# Patient Record
Sex: Female | Born: 1975 | Race: Black or African American | Hispanic: No | Marital: Single | Smoking: Never smoker
Health system: Southern US, Community
[De-identification: ages and names within clinical notes are randomized; demographics above are authoritative.]

## PROBLEM LIST (undated history)

## (undated) DIAGNOSIS — I10 Essential (primary) hypertension: Secondary | ICD-10-CM

---

## 1997-08-12 ENCOUNTER — Emergency Department (HOSPITAL_COMMUNITY): Admission: EM | Admit: 1997-08-12 | Discharge: 1997-08-12 | Payer: Self-pay | Admitting: Emergency Medicine

## 1997-10-24 ENCOUNTER — Emergency Department (HOSPITAL_COMMUNITY): Admission: EM | Admit: 1997-10-24 | Discharge: 1997-10-24 | Payer: Self-pay | Admitting: Emergency Medicine

## 1997-11-21 ENCOUNTER — Other Ambulatory Visit: Admission: RE | Admit: 1997-11-21 | Discharge: 1997-11-21 | Payer: Self-pay | Admitting: Gynecology

## 1997-12-19 ENCOUNTER — Emergency Department (HOSPITAL_COMMUNITY): Admission: EM | Admit: 1997-12-19 | Discharge: 1997-12-19 | Payer: Self-pay | Admitting: Emergency Medicine

## 1998-06-17 ENCOUNTER — Emergency Department (HOSPITAL_COMMUNITY): Admission: EM | Admit: 1998-06-17 | Discharge: 1998-06-17 | Payer: Self-pay | Admitting: Emergency Medicine

## 1998-06-19 ENCOUNTER — Emergency Department (HOSPITAL_COMMUNITY): Admission: EM | Admit: 1998-06-19 | Discharge: 1998-06-19 | Payer: Self-pay | Admitting: Emergency Medicine

## 1999-06-30 ENCOUNTER — Encounter: Payer: Self-pay | Admitting: Orthopedic Surgery

## 1999-06-30 ENCOUNTER — Ambulatory Visit (HOSPITAL_COMMUNITY): Admission: RE | Admit: 1999-06-30 | Discharge: 1999-06-30 | Payer: Self-pay | Admitting: Orthopedic Surgery

## 2001-06-14 ENCOUNTER — Emergency Department (HOSPITAL_COMMUNITY): Admission: EM | Admit: 2001-06-14 | Discharge: 2001-06-14 | Payer: Self-pay | Admitting: Emergency Medicine

## 2001-06-14 ENCOUNTER — Encounter: Payer: Self-pay | Admitting: Emergency Medicine

## 2002-11-12 ENCOUNTER — Encounter (INDEPENDENT_AMBULATORY_CARE_PROVIDER_SITE_OTHER): Payer: Self-pay | Admitting: Cardiology

## 2002-11-12 ENCOUNTER — Ambulatory Visit: Admission: RE | Admit: 2002-11-12 | Discharge: 2002-11-12 | Payer: Self-pay | Admitting: Internal Medicine

## 2004-01-09 ENCOUNTER — Encounter: Admission: RE | Admit: 2004-01-09 | Discharge: 2004-01-09 | Payer: Self-pay | Admitting: Allergy and Immunology

## 2005-04-11 ENCOUNTER — Emergency Department (HOSPITAL_COMMUNITY): Admission: EM | Admit: 2005-04-11 | Discharge: 2005-04-11 | Payer: Self-pay | Admitting: Emergency Medicine

## 2005-05-11 ENCOUNTER — Emergency Department (HOSPITAL_COMMUNITY): Admission: EM | Admit: 2005-05-11 | Discharge: 2005-05-11 | Payer: Self-pay | Admitting: Family Medicine

## 2005-06-20 ENCOUNTER — Other Ambulatory Visit: Admission: RE | Admit: 2005-06-20 | Discharge: 2005-06-20 | Payer: Self-pay | Admitting: Gynecology

## 2005-08-22 ENCOUNTER — Emergency Department (HOSPITAL_COMMUNITY): Admission: EM | Admit: 2005-08-22 | Discharge: 2005-08-23 | Payer: Self-pay | Admitting: Emergency Medicine

## 2008-04-29 ENCOUNTER — Encounter (INDEPENDENT_AMBULATORY_CARE_PROVIDER_SITE_OTHER): Payer: Self-pay | Admitting: Plastic Surgery

## 2008-04-29 ENCOUNTER — Ambulatory Visit (HOSPITAL_BASED_OUTPATIENT_CLINIC_OR_DEPARTMENT_OTHER): Admission: RE | Admit: 2008-04-29 | Discharge: 2008-04-30 | Payer: Self-pay | Admitting: Orthopedic Surgery

## 2010-04-14 LAB — POCT HEMOGLOBIN-HEMACUE: Hemoglobin: 13 g/dL (ref 12.0–15.0)

## 2010-05-18 NOTE — Op Note (Signed)
Christine Chaney, Christine Chaney          ACCOUNT NO.:  [id number]   MEDICAL RECORD NO.:  [id number]          PATIENT TYPE:  AMB   LOCATION:  DSC                          FACILITY:  [facility]   PHYSICIAN:  Brantley Persons, M.D.DATE OF BIRTH:  1975/06/25   DATE OF PROCEDURE:  04/29/2008  DATE OF DISCHARGE:                               OPERATIVE REPORT   PREOPERATIVE DIAGNOSIS:  Bilateral macromastia.   POSTOPERATIVE DIAGNOSIS:  Bilateral macromastia.   PROCEDURE:  Bilateral reduction mammoplasties.   ATTENDING SURGEON:  Brantley Persons, MD   ANESTHESIA:  General.   ANESTHESIOLOGIST:  Sheldon Silvan, MD   ESTIMATED BLOOD LOSS:  200 mL.   FLUID REPLACEMENT:  2900 mL of crystalloid.   URINE OUTPUT:  1250 mL.   COMPLICATIONS:  None.   INDICATIONS FOR PROCEDURE:  The patient is a 35 year old African  American female who has bilateral macromastia that is clinically  symptomatic.  She presents to undergo bilateral reduction mammoplasties.   JUSTIFICATION FOR THE PATIENT'S NIGHT STAY:  Progressive pain control  along with ambulation and monitoring of the nipples and breast flaps.   PROCEDURE:  The patient was marked in the preop holding area in the  pattern of wise for the future bilateral reduction mammoplasties.  She  was then taken back to the OR and placed on the table in supine  position.  After adequate general anesthesia was obtained, the patient's  chest was prepped with Techni-Care and draped in sterile fashion.  The  base of the breast had been injected with 1% lidocaine with epinephrine.  After adequate hemostasis and anesthesia had taken effect, the procedure  was begun.   Both of the breast reductions were performed in the following similar  manner.  The nipple-areolar complex was marked with a 45-mm nipple  marker.  This was then incised and skin de-epithelialized around the  nipple-areolar complex down to the inframammary crease in inferior  pedicle pattern.  Next, the  medial, superior and lateral skin flaps were  elevated down to the chest wall.  The excess fat and glandular tissue  were removed from the inferior pedicle.  The wound was irrigated with  saline irrigation.  Meticulous hemostasis was obtained with the Bovie  electrocautery.  The nipple-areolar complex was examined and found to be  pink and viable.  The inferior pedicle was centralized using 3-0 Prolene  suture.  A #10 JP flat fully fluted drain was placed into the wound.  The skin flaps were brought together at the inverted T junction with a 2-  0 Prolene suture.  The incisions were stapled for temporary closure.  The breasts were compared and found to have good shape and asymmetry.  These sutures were then closed from the medial aspect of the JP drain to  the medial aspect of the [facility] incision by first placing a few 3-0  Monocryl sutures in the dermal layer and then both the dermal and  cuticular layer were closed using a 2-0 Quill PDO barbed suture.  Lateral to the JP drain incision was closed using 3-0 Monocryl and  dermal layer followed by 3-0 Monocryl running intracuticular stitch on  the skin.  The vertical limb of the wise pattern was closed using 3-0  Monocryl in the dermal layer.   The patient was then placed in the upright position.  The future  location of the nipple-areolar complexes were marked on both breast  mounds using a 45-mm nipple marker.  She was then placed back in the  recumbent position.   Both of the nipple-areolar complexes were then brought out onto the  breast mounds in the following similar manner.  The skin was incised as  marked and the soft tissue removed full thickness.  The nipple-areolar  complex was examined, found to be pink and viable then brought out  through this aperture and sewn in place using 4-0 Monocryl and on the  dermal layer followed by 5-0 Monocryl running intracuticular stitch on  the skin.  This 5-0 Monocryl suture was then brought down  to close the  vertical limb of the wise pattern as well.  The JP drain was sewn in  place using a 3-0 nylon suture.  The skin and soft tissue of the breast  was then injected with 0.5% Marcaine with epinephrine to provide a  postsurgical anesthetic block.  The incision was dressed with Benzoin  and Steri-Strips, and the nipples additionally bacitracin ointment and  Adaptic.  A 4 x 4s were placed over the incisions and ABD pads in the  axillary areas.  The patient was then placed into a light postoperative  support bra.  There were no complications.  The patient tolerated the  procedure well.  The final needle and sponge counts were reported to be  correct and the end of case.  The patient was then awakened from general  anesthesia and taken to recovery room in stable condition.  She has  recovered without complications.  The patient said, she would rather  stay overnight and go home and this was appropriate.  She will remain in  the [facility] overnight for aggressive pain control along with ambulation with  monitoring of the nipples and breast flaps.  Discharge planned for the  morning.           ______________________________  Brantley Persons, M.D.     MC/MEDQ  D:  04/29/2008  T:  04/30/2008  Job:  [id number]

## 2011-09-10 ENCOUNTER — Ambulatory Visit (HOSPITAL_BASED_OUTPATIENT_CLINIC_OR_DEPARTMENT_OTHER)
Admission: RE | Admit: 2011-09-10 | Discharge: 2011-09-10 | Disposition: A | Discharge status: Home / Self Care | Payer: Self-pay | Source: Ambulatory Visit | Attending: *Deleted | Admitting: *Deleted

## 2011-09-10 ENCOUNTER — Other Ambulatory Visit (HOSPITAL_BASED_OUTPATIENT_CLINIC_OR_DEPARTMENT_OTHER): Payer: Self-pay | Admitting: *Deleted

## 2011-09-10 DIAGNOSIS — M79609 Pain in unspecified limb: Secondary | ICD-10-CM | POA: Insufficient documentation

## 2011-09-10 DIAGNOSIS — M79606 Pain in leg, unspecified: Secondary | ICD-10-CM

## 2012-08-28 ENCOUNTER — Other Ambulatory Visit: Payer: Self-pay

## 2012-09-04 ENCOUNTER — Telehealth (HOSPITAL_COMMUNITY): Payer: Self-pay | Admitting: MS"

## 2012-09-04 NOTE — Telephone Encounter (Signed)
Patient called with questions regarding her appointment tomorrow. She inquired about it being too early for the chorionic villus sampling procedure to be performed tomorrow, given that she is approximately [redacted] weeks gestation. Reviewed that patient is correct that CVS is typically done at 10-[redacted] weeks gestation. Tomorrow, the appointment is for genetic counseling only to provide patient with information regarding all of her screening and testing options. Discussed that if after that appointment she elects to pursue CVS, then we can schedule it from that point.   Christine Chaney 09/04/2012 12:54 PM

## 2012-09-05 ENCOUNTER — Encounter (HOSPITAL_COMMUNITY): Payer: Self-pay

## 2012-09-05 ENCOUNTER — Ambulatory Visit (HOSPITAL_COMMUNITY)
Admission: RE | Admit: 2012-09-05 | Discharge: 2012-09-05 | Disposition: A | Discharge status: Home / Self Care | Payer: BC Managed Care – PPO | Source: Ambulatory Visit | Attending: Obstetrics and Gynecology | Admitting: Obstetrics and Gynecology

## 2012-09-05 NOTE — Progress Notes (Signed)
Genetic Counseling  Visit Summary Note  Appointment Date: 09/05/12 Referred By: Carrington Clamp, MD Date of Birth: Feb 16, 1975  Pregnancy history: G1P0 Estimated Due Date: 04/16/13 Estimated Gestational Age: [redacted]w[redacted]d  Christine Chaney was seen for genetic counseling because of a maternal age of 47.     She was counseled regarding maternal age and the association with risk for chromosome conditions due to nondisjunction with aging of the ova.   We reviewed chromosomes, nondisjunction, and the associated 1 in 50 risk for fetal aneuploidy related to a maternal age of 12 at [redacted]w[redacted]d gestation.  She was counseled that the risk for aneuploidy decreases as gestational age increases, accounting for those pregnancies which spontaneously abort.  We specifically discussed Down syndrome (trisomy 42), trisomies 4 and 22, and sex chromosome aneuploidies (47,XXX and 47,XXY) including the common features and prognoses of each.   We reviewed available screening options including First Screen, Quad screen, noninvasive prenatal screening (NIPS)/cell free fetal DNA (cffDNA) testing, and detailed ultrasound.  She was counseled that screening tests are used to modify a patient's a priori risk for aneuploidy, typically based on age. This estimate provides a pregnancy specific risk assessment. We reviewed the benefits and limitations of each option. Specifically, we discussed the conditions for which each test screens, the detection rates, and false positive rates of each. She was also counseled regarding diagnostic testing via CVS and amniocentesis. We reviewed the associated risks for complications for both CVS and amniocentesis, including spontaneous pregnancy loss. After consideration of all the options, [name] expressed interest in returning for NIPS/cffDNA testing.  She was scheduled to return in ~[redacted] weeks gestation.      She also expressed interest in returning for a nuchal translucency ultrasound at approximately [redacted]  weeks gestation and a detailed anatomy ultrasound at ~[redacted] weeks gestation. She was scheduled to return for a nuchal translucency ultrasound in 4 weeks.  We discussed that the anatomy ultrasound could be performed at the [facility] or at her primary obstetrician's offices.  She understands that screening tests cannot rule out all birth defects or genetic syndromes.    [name] was provided with written information regarding sickle cell anemia (SCA) including the carrier frequency and incidence in the African-American population, the availability of carrier testing and prenatal diagnosis if indicated.  In addition, we discussed that hemoglobinopathies are routinely screened for as part of the [place] newborn screening panel.  [name] had hemoglobin electrophoresis through her obstetrician's office.  We reviewed these normal results.  Both family histories were reviewed and found to be noncontributory for birth defects, mental retardation, and known genetic conditions. Without further information regarding the provided family history, an accurate genetic risk cannot be calculated. Further genetic counseling is warranted if more information is obtained.  [name] denied exposure to environmental toxins or chemical agents. She denied the use of alcohol, tobacco or street drugs. She denied significant viral illnesses during the course of her pregnancy. Her medical and surgical histories were noncontributory.   I counseled [name] regarding the above risks and available options.  The approximate face-to-face time with the genetic counselor was 55 minutes.  Despina Arias, MS Certified Genetic Counselor

## 2012-09-26 ENCOUNTER — Encounter (HOSPITAL_COMMUNITY): Payer: Self-pay

## 2012-09-26 ENCOUNTER — Ambulatory Visit (HOSPITAL_COMMUNITY)
Admission: RE | Admit: 2012-09-26 | Discharge: 2012-09-26 | Disposition: A | Discharge status: Home / Self Care | Payer: BC Managed Care – PPO | Source: Ambulatory Visit | Attending: Obstetrics and Gynecology | Admitting: Obstetrics and Gynecology

## 2012-09-26 ENCOUNTER — Other Ambulatory Visit: Payer: Self-pay

## 2012-09-26 DIAGNOSIS — O351XX0 Maternal care for (suspected) chromosomal abnormality in fetus, not applicable or unspecified: Secondary | ICD-10-CM | POA: Insufficient documentation

## 2012-09-26 DIAGNOSIS — O3510X0 Maternal care for (suspected) chromosomal abnormality in fetus, unspecified, not applicable or unspecified: Secondary | ICD-10-CM | POA: Insufficient documentation

## 2012-10-01 ENCOUNTER — Other Ambulatory Visit (HOSPITAL_COMMUNITY): Payer: Self-pay | Admitting: Obstetrics and Gynecology

## 2012-10-01 DIAGNOSIS — Z3682 Encounter for antenatal screening for nuchal translucency: Secondary | ICD-10-CM

## 2012-10-02 ENCOUNTER — Ambulatory Visit (HOSPITAL_COMMUNITY)
Admission: RE | Admit: 2012-10-02 | Discharge: 2012-10-02 | Disposition: A | Discharge status: Home / Self Care | Payer: BC Managed Care – PPO | Source: Ambulatory Visit | Attending: Obstetrics and Gynecology | Admitting: Obstetrics and Gynecology

## 2012-10-02 ENCOUNTER — Ambulatory Visit (HOSPITAL_COMMUNITY): Admission: RE | Admit: 2012-10-02 | Payer: BC Managed Care – PPO | Source: Ambulatory Visit

## 2012-10-02 ENCOUNTER — Encounter (HOSPITAL_COMMUNITY): Payer: Self-pay

## 2012-10-02 DIAGNOSIS — Z3689 Encounter for other specified antenatal screening: Secondary | ICD-10-CM | POA: Insufficient documentation

## 2012-10-02 DIAGNOSIS — Z3682 Encounter for antenatal screening for nuchal translucency: Secondary | ICD-10-CM

## 2012-10-02 DIAGNOSIS — O3510X0 Maternal care for (suspected) chromosomal abnormality in fetus, unspecified, not applicable or unspecified: Secondary | ICD-10-CM | POA: Insufficient documentation

## 2012-10-02 DIAGNOSIS — O351XX0 Maternal care for (suspected) chromosomal abnormality in fetus, not applicable or unspecified: Secondary | ICD-10-CM | POA: Insufficient documentation

## 2012-10-02 NOTE — ED Notes (Signed)
Patient states had "light bleeding" last night but none today. Does have some left upper thigh (groin) discomfort at the end of the day.

## 2012-10-03 ENCOUNTER — Other Ambulatory Visit (HOSPITAL_COMMUNITY): Payer: BC Managed Care – PPO

## 2012-10-08 ENCOUNTER — Telehealth (HOSPITAL_COMMUNITY): Payer: Self-pay

## 2012-10-08 NOTE — Telephone Encounter (Signed)
Called Christine Chaney to discuss her cell free fetal DNA test results.  Mrs. Christine Chaney had Panorama testing through [facility].  Testing was offered because of a maternal age of 27.   The patient was identified by name and DOB.  We reviewed that the percentage of fetal fraction was too low and a result was not obtained.  Submission of a new sample was recommended. We discussed the options of First trimester screening, maternal serum Quad screening, CVS, amniocentesis, serial sonography, and repeat cffDNA testing. We reviewed each of these options in detail.  We discussed that the percentage of cell free DNA from the pregnancy increases at a rate of ~0.1% per week until 20 weeks.  Considering that her fetal fraction was 2.8%, we discussed that she may wish to consider waiting until a later gestational age to repeat the cffDNA testing.  Reviewed that after 20 weeks, the percentage of cell free DNA from the pregnancy increases at a rate of 1%.  [name] had a NT ultrasound last week. She elected to return to clinic tomorrow to complete the biochemical portion of First trimester screening.  She will use the results of her First trimester screening to decide whether or not she would like to pursue diagnostic testing or repeat cffDNA testing.  All questions were answered to her satisfaction, she was encouraged to call with additional questions or concerns.   Despina Arias, MS Certified Genetic Counselor

## 2012-10-08 NOTE — Telephone Encounter (Signed)
Called patient and left vm for her to return my call to review her cfDNA results. Despina Arias, MS Certified genetic counselor

## 2012-10-09 ENCOUNTER — Ambulatory Visit (HOSPITAL_COMMUNITY)
Admission: RE | Admit: 2012-10-09 | Discharge: 2012-10-09 | Disposition: A | Discharge status: Home / Self Care | Payer: BC Managed Care – PPO | Source: Ambulatory Visit | Attending: Obstetrics and Gynecology | Admitting: Obstetrics and Gynecology

## 2012-10-09 ENCOUNTER — Other Ambulatory Visit: Payer: Self-pay

## 2012-10-09 DIAGNOSIS — O351XX0 Maternal care for (suspected) chromosomal abnormality in fetus, not applicable or unspecified: Secondary | ICD-10-CM | POA: Insufficient documentation

## 2012-10-09 DIAGNOSIS — O3510X0 Maternal care for (suspected) chromosomal abnormality in fetus, unspecified, not applicable or unspecified: Secondary | ICD-10-CM | POA: Insufficient documentation

## 2012-10-16 ENCOUNTER — Telehealth (HOSPITAL_COMMUNITY): Payer: Self-pay

## 2012-10-16 NOTE — Telephone Encounter (Signed)
Called Christine Chaney to discuss the results of her First trimester screen.  Christine Chaney was identified by name and DOB.  We reviewed that these results are wnl, reducing the likelihood of fetal Down syndrome from 1 in 69 (age) to 1 in 2421.  We also discussed the screen adjusted risks for fetal trisomies 13/18 (1 in 8, age, to 1 in 39).  We discussed that this screen significantly reduces the likelihood of fetal aneuploidy, but does not eliminated it.  We discussed the availability of redrawing the cell free DNA (cfDNA) testing and explained that although this testing is also a screening test, it has a higher specificity and sensitivity.  All questions and concerns were discussed.  After thoughtful consideration, Christine Chaney declined the option of further testing at this time.  She will return for a detailed anatomy ultrasound at ~[redacted] weeks gestation and may elect further screening at that time, based on the u/s findings.  Donald Prose, MS Certified Genetic Counselor

## 2012-11-09 ENCOUNTER — Other Ambulatory Visit (HOSPITAL_COMMUNITY): Payer: Self-pay | Admitting: Obstetrics and Gynecology

## 2012-11-09 DIAGNOSIS — Z0489 Encounter for examination and observation for other specified reasons: Secondary | ICD-10-CM

## 2012-11-09 DIAGNOSIS — O09529 Supervision of elderly multigravida, unspecified trimester: Secondary | ICD-10-CM

## 2012-11-13 ENCOUNTER — Ambulatory Visit (HOSPITAL_COMMUNITY): Admission: RE | Admit: 2012-11-13 | Payer: BC Managed Care – PPO | Source: Ambulatory Visit

## 2013-03-10 ENCOUNTER — Inpatient Hospital Stay (HOSPITAL_COMMUNITY): Admission: AD | Admit: 2013-03-10 | Payer: Self-pay | Source: Ambulatory Visit | Admitting: Obstetrics and Gynecology

## 2013-04-10 ENCOUNTER — Encounter: Payer: Self-pay | Admitting: Advanced Practice Midwife

## 2013-04-15 ENCOUNTER — Encounter: Payer: Self-pay | Admitting: Advanced Practice Midwife

## 2013-04-26 ENCOUNTER — Ambulatory Visit: Payer: Self-pay | Admitting: Advanced Practice Midwife

## 2013-05-28 ENCOUNTER — Ambulatory Visit: Payer: Self-pay | Admitting: Advanced Practice Midwife

## 2013-08-01 ENCOUNTER — Encounter (HOSPITAL_COMMUNITY): Payer: Self-pay | Admitting: *Deleted

## 2013-11-04 ENCOUNTER — Encounter (HOSPITAL_COMMUNITY): Payer: Self-pay | Admitting: *Deleted

## 2014-08-18 ENCOUNTER — Emergency Department (HOSPITAL_COMMUNITY): Payer: BLUE CROSS/BLUE SHIELD

## 2014-08-18 ENCOUNTER — Emergency Department (HOSPITAL_COMMUNITY)
Admission: EM | Admit: 2014-08-18 | Discharge: 2014-08-18 | Disposition: A | Discharge status: Home / Self Care | Payer: BLUE CROSS/BLUE SHIELD | Attending: Emergency Medicine | Admitting: Emergency Medicine

## 2014-08-18 ENCOUNTER — Encounter (HOSPITAL_COMMUNITY): Payer: Self-pay | Admitting: Emergency Medicine

## 2014-08-18 DIAGNOSIS — R200 Anesthesia of skin: Secondary | ICD-10-CM

## 2014-08-18 DIAGNOSIS — R202 Paresthesia of skin: Secondary | ICD-10-CM | POA: Insufficient documentation

## 2014-08-18 DIAGNOSIS — Z79899 Other long term (current) drug therapy: Secondary | ICD-10-CM | POA: Diagnosis not present

## 2014-08-18 DIAGNOSIS — R51 Headache: Secondary | ICD-10-CM | POA: Insufficient documentation

## 2014-08-18 DIAGNOSIS — H578 Other specified disorders of eye and adnexa: Secondary | ICD-10-CM | POA: Diagnosis not present

## 2014-08-18 DIAGNOSIS — I10 Essential (primary) hypertension: Secondary | ICD-10-CM | POA: Diagnosis not present

## 2014-08-18 HISTORY — DX: Essential (primary) hypertension: I10

## 2014-08-18 LAB — COMPREHENSIVE METABOLIC PANEL
ALT: 23 U/L (ref 14–54)
ANION GAP: 10 (ref 5–15)
AST: 26 U/L (ref 15–41)
Albumin: 3.6 g/dL (ref 3.5–5.0)
Alkaline Phosphatase: 50 U/L (ref 38–126)
BUN: 9 mg/dL (ref 6–20)
CALCIUM: 9.5 mg/dL (ref 8.9–10.3)
CHLORIDE: 101 mmol/L (ref 101–111)
CO2: 25 mmol/L (ref 22–32)
Creatinine, Ser: 0.87 mg/dL (ref 0.44–1.00)
Glucose, Bld: 102 mg/dL — ABNORMAL HIGH (ref 65–99)
Potassium: 3.9 mmol/L (ref 3.5–5.1)
SODIUM: 136 mmol/L (ref 135–145)
Total Bilirubin: 0.6 mg/dL (ref 0.3–1.2)
Total Protein: 6.9 g/dL (ref 6.5–8.1)

## 2014-08-18 LAB — CBC
HEMATOCRIT: 36.2 % (ref 36.0–46.0)
Hemoglobin: 12.1 g/dL (ref 12.0–15.0)
MCH: 28.7 pg (ref 26.0–34.0)
MCHC: 33.4 g/dL (ref 30.0–36.0)
MCV: 86 fL (ref 78.0–100.0)
PLATELETS: 290 10*3/uL (ref 150–400)
RBC: 4.21 MIL/uL (ref 3.87–5.11)
RDW: 13 % (ref 11.5–15.5)
WBC: 8.2 10*3/uL (ref 4.0–10.5)

## 2014-08-18 LAB — DIFFERENTIAL
BASOS PCT: 1 % (ref 0–1)
Basophils Absolute: 0 10*3/uL (ref 0.0–0.1)
EOS PCT: 1 % (ref 0–5)
Eosinophils Absolute: 0.1 10*3/uL (ref 0.0–0.7)
Lymphocytes Relative: 42 % (ref 12–46)
Lymphs Abs: 3.4 10*3/uL (ref 0.7–4.0)
MONO ABS: 0.5 10*3/uL (ref 0.1–1.0)
MONOS PCT: 6 % (ref 3–12)
Neutro Abs: 4.2 10*3/uL (ref 1.7–7.7)
Neutrophils Relative %: 50 % (ref 43–77)

## 2014-08-18 LAB — I-STAT CHEM 8, ED
BUN: 11 mg/dL (ref 6–20)
CALCIUM ION: 1.16 mmol/L (ref 1.12–1.23)
CHLORIDE: 102 mmol/L (ref 101–111)
Creatinine, Ser: 0.8 mg/dL (ref 0.44–1.00)
Glucose, Bld: 102 mg/dL — ABNORMAL HIGH (ref 65–99)
HCT: 39 % (ref 36.0–46.0)
HEMOGLOBIN: 13.3 g/dL (ref 12.0–15.0)
Potassium: 3.9 mmol/L (ref 3.5–5.1)
SODIUM: 137 mmol/L (ref 135–145)
TCO2: 23 mmol/L (ref 0–100)

## 2014-08-18 LAB — PROTIME-INR
INR: 1.02 (ref 0.00–1.49)
PROTHROMBIN TIME: 13.6 s (ref 11.6–15.2)

## 2014-08-18 LAB — I-STAT TROPONIN, ED: TROPONIN I, POC: 0 ng/mL (ref 0.00–0.08)

## 2014-08-18 LAB — APTT: aPTT: 31 seconds (ref 24–37)

## 2014-08-18 LAB — ETHANOL

## 2014-08-18 NOTE — Discharge Instructions (Signed)
We saw you in the ER for THE NUMBNESS. All the results in the ER are normal, labs and imaging. We are not sure what is causing your symptoms. The workup in the ER is not complete, and is limited to screening for life threatening and emergent conditions only, so please see THE NEUROLOGIST if the symptoms persists.  Paresthesia Paresthesia is an abnormal burning or prickling sensation. This sensation is generally felt in the hands, arms, legs, or feet. However, it may occur in any part of the body. It is usually not painful. The feeling may be described as:  Tingling or numbness.  "Pins and needles."  Skin crawling.  Buzzing.  Limbs "falling asleep."  Itching. Most people experience temporary (transient) paresthesia at some time in their lives. CAUSES  Paresthesia may occur when you breathe too quickly (hyperventilation). It can also occur without any apparent cause. Commonly, paresthesia occurs when pressure is placed on a nerve. The feeling quickly goes away once the pressure is removed. For some people, however, paresthesia is a long-lasting (chronic) condition caused by an underlying disorder. The underlying disorder may be:  A traumatic, direct injury to nerves. Examples include a:  Broken (fractured) neck.  Fractured skull.  A disorder affecting the brain and spinal cord (central nervous system). Examples include:  Transverse myelitis.  Encephalitis.  Transient ischemic attack.  Multiple sclerosis.  Stroke.  Tumor or blood vessel problems, such as an arteriovenous malformation pressing against the brain or spinal cord.  A condition that damages the peripheral nerves (peripheral neuropathy). Peripheral nerves are not part of the brain and spinal cord. These conditions include:  Diabetes.  Peripheral vascular disease.  Nerve entrapment syndromes, such as carpal tunnel syndrome.  Shingles.  Hypothyroidism.  Vitamin B12 deficiencies.  Alcoholism.  Heavy  metal poisoning (lead, arsenic).  Rheumatoid arthritis.  Systemic lupus erythematosus. DIAGNOSIS  Your caregiver will attempt to find the underlying cause of your paresthesia. Your caregiver may:  Take your medical history.  Perform a physical exam.  Order various lab tests.  Order imaging tests. TREATMENT  Treatment for paresthesia depends on the underlying cause. HOME CARE INSTRUCTIONS  Avoid drinking alcohol.  You may consider massage or acupuncture to help relieve your symptoms.  Keep all follow-up appointments as directed by your caregiver. SEEK IMMEDIATE MEDICAL CARE IF:   You feel weak.  You have trouble walking or moving.  You have problems with speech or vision.  You feel confused.  You cannot control your bladder or bowel movements.  You feel numbness after an injury.  You faint.  Your burning or prickling feeling gets worse when walking.  You have pain, cramps, or dizziness.  You develop a rash. MAKE SURE YOU:  Understand these instructions.  Will watch your condition.  Will get help right away if you are not doing well or get worse. Document Released: 12/10/2001 Document Revised: 03/14/2011 Document Reviewed: 09/10/2010 [facility] Patient Information 2015 [place], [place]. This information is not intended to replace advice given to you by your health care provider. Make sure you discuss any questions you have with your health care provider.

## 2014-08-18 NOTE — ED Notes (Signed)
ER RN, RR RN, Neurologist at bedside

## 2014-08-18 NOTE — Code Documentation (Signed)
Code stroke called at 0052 for this black female pt who was LSW at 2100 hrs.  Pt states she was lying in bed  when she developed sudden onset burning and left facial numbness and tingling.    Within 30 minutes the numbness and  tingling advanced to her left hand and down to her left foot. Pt states she developed a headache described as pressure behind he left eye rated 4/10 on the pain scale.  After having no improvement in her symptoms she drove herself to [facility] for evaluation. Pt arrived [facility] at 0045, code stroke called at 15.  Pt was cleared for CT by Dr Leia Alf at 0057 arriving at CT at  0058.  Pt was returned to [facility] where her NIHSS was scored  1 for decreased sensation in LUE .   CBG 112.   CT scan unremarkable findings called to Dr Amada Jupiter at 0148 hrs. TPA not given due to mild symptoms.  Will remain in TPA window until 0130 hrs MRI  ordered.  Disposition to be determined after MRI resulted.

## 2014-08-18 NOTE — ED Provider Notes (Addendum)
CSN: [id number]     Arrival date & time 08/18/14  0045 History   This chart was scribed for Derwood Kaplan, MD by Arlan Organ, ED Scribe. This patient was seen in room [facility] and the patient's care was started 3:20 AM.   Chief Complaint  Patient presents with  . Code Stroke   The history is provided by the patient. No language interpreter was used.    HPI Comments: Christine Chaney is a 39 y.o. female without any pertinent past medical history who presents to the Emergency Department here for code stroke this morning. Pt states she experienced L sided facial and L arm numbness and tingling onset 10:00 PM lastnight. Last known normal at 9:00 PM. No aggravating or alleviating factors at this time. Mr. Sadek also reports mild discomfort to her eyes, however, she denies any visual changes. No neurological deficits. Denies any history of headaches. No history of strokes. No known allergies to medications.   Past Medical History  Diagnosis Date  . Hypertension    History reviewed. No pertinent past surgical history. No family history on file. Social History  Substance Use Topics  . Smoking status: Never Smoker   . Smokeless tobacco: Never Used  . Alcohol Use: No   OB History    Gravida Para Term Preterm AB TAB SAB Ectopic Multiple Living   1              Review of Systems  Constitutional: Negative for fever and chills.  Eyes: Negative for visual disturbance.  Respiratory: Negative for cough and shortness of breath.   Gastrointestinal: Negative for nausea, vomiting and abdominal pain.  Musculoskeletal: Negative for back pain.  Neurological: Positive for numbness and headaches. Negative for weakness.  Psychiatric/Behavioral: Negative for confusion.  All other systems reviewed and are negative.     Allergies  Review of patient's allergies indicates no known allergies.  Home Medications   Prior to Admission medications   Medication Sig Start Date End Date Taking?  Authorizing Provider  hydrochlorothiazide (HYDRODIURIL) 25 MG tablet Take 25 mg by mouth. 08/07/14 08/07/15 Yes Historical Provider, MD   Triage Vitals: BP 122/74 mmHg  Pulse 81  Resp 15  SpO2 97%   Physical Exam  Constitutional: She is oriented to person, place, and time. She appears well-developed and well-nourished. No distress.  HENT:  Head: Normocephalic and atraumatic.  Eyes: EOM are normal.  Abdominal: She exhibits no distension. There is no tenderness.  Neurological: She is alert and oriented to person, place, and time.  Normal sensory and motor exam to upper and lower extremities Cranial 2-12 intact  Skin: Skin is warm and dry.  Psychiatric: She has a normal mood and affect. Judgment normal.  Nursing note and vitals reviewed.   ED Course  Procedures (including critical care time)  DIAGNOSTIC STUDIES: Oxygen Saturation is 97% on RA, normal by my interpretation.    COORDINATION OF CARE: 3:25 AM-Discussed treatment plan with pt at bedside and pt agreed to plan.     3:26 AM -Pts symptoms now limited to just the toes. MRI results discussed with pt.  Labs Review Labs Reviewed  COMPREHENSIVE METABOLIC PANEL - Abnormal; Notable for the following:    Glucose, Bld 102 (*)    All other components within normal limits  I-STAT CHEM 8, ED - Abnormal; Notable for the following:    Glucose, Bld 102 (*)    All other components within normal limits  ETHANOL  PROTIME-INR  APTT  CBC  DIFFERENTIAL  URINE RAPID DRUG SCREEN, HOSP PERFORMED  URINALYSIS, ROUTINE W REFLEX MICROSCOPIC (NOT AT [facility])  I-STAT TROPOININ, ED    Imaging Review Ct Head Wo Contrast  08/18/2014   CLINICAL DATA:  Code stroke. Acute onset of left arm numbness and left facial numbness. Initial encounter.  EXAM: CT HEAD WITHOUT CONTRAST  TECHNIQUE: Contiguous axial images were obtained from the base of the skull through the vertex without intravenous contrast.  COMPARISON:  CT of the paranasal sinuses performed  01/09/2004  FINDINGS: There is no evidence of acute infarction, mass lesion, or intra- or extra-axial hemorrhage on CT.  The posterior fossa, including the cerebellum, brainstem and fourth ventricle, is within normal limits. The third and lateral ventricles, and basal ganglia are unremarkable in appearance. The cerebral hemispheres are symmetric in appearance, with normal gray-white differentiation. No mass effect or midline shift is seen.  There is no evidence of fracture; visualized osseous structures are unremarkable in appearance. The orbits are within normal limits. The paranasal sinuses and mastoid air cells are well-aerated. No significant soft tissue abnormalities are seen.  IMPRESSION: Unremarkable noncontrast CT of the head.  These results were called by telephone at the time of interpretation on 08/18/2014 at 1:48 am to Dr. Amada Jupiter, who verbally acknowledged these results.   Electronically Signed   By: Roanna Raider M.D.   On: 08/18/2014 01:49   Mr Brain Wo Contrast  08/18/2014   CLINICAL DATA:  LEFT face tingling beginning yesterday around 9 p.m., progressing to LEFT leg. LEFT headache. History of hypertension. Followup code stroke.  EXAM: MRI HEAD WITHOUT CONTRAST  TECHNIQUE: Multiplanar, multiecho pulse sequences of the brain and surrounding structures were obtained without intravenous contrast.  COMPARISON:  CT head August 18, 2014 at 1:09 a.m.  FINDINGS: The ventricles and sulci are normal for patient's age. No suspicious parenchymal signal, mass lesions, mass effect. A few (less than 5) subcentimeter supratentorial white matter T2 hyperintensities in a nonspecific distribution. No reduced diffusion to suggest acute ischemia. No susceptibility artifact to suggest hemorrhage.  No abnormal extra-axial fluid collections. No extra-axial masses though, contrast enhanced sequences would be more sensitive. Normal major intracranial vascular flow voids seen at the skull base.  Ocular globes and  orbital contents are unremarkable though not tailored for evaluation. Slightly expanded sella with thin rind of pituitary tissue along the floor of the sella. Visualized paranasal sinuses and mastoid air cells are well-aerated. No suspicious calvarial bone marrow signal. No abnormal sellar expansion. Craniocervical junction maintained.  IMPRESSION: No acute intracranial process, specifically no acute ischemia.  Mild nonspecific white matter changes, can be seen with chronic small vessel ischemic disease.  Partial empty sella.   Electronically Signed   By: Awilda Metro M.D.   On: 08/18/2014 03:09     EKG Interpretation   Date/Time:  Monday August 18 2014 01:13:31 EDT Ventricular Rate:  84 PR Interval:  166 QRS Duration: 84 QT Interval:  374 QTC Calculation: 442 R Axis:   26 Text Interpretation:  Sinus rhythm Low voltage, precordial leads No acute  changes No old tracing to compare Confirmed by Rhunette Croft, MD, Janey Genta [phone])  on 08/18/2014 4:21:47 AM      MDM   Final diagnoses:  Paresthesia   I personally performed the services described in this documentation, which was scribed in my presence. The recorded information has been reviewed and is accurate.  Pt comes in with L sided numbness. Sudden onset. Hx of HTN. Pt also has a mild h/a, L  sided behind her eye. No visual complains. NIHSS 1 for the subjective numbness. Code stroke was activated - pt seen by Neuro. MRI completed, and there is no stroke, acute.  Results discussed. Pt has mild tingling in the toes now - h/a resolved. Doesn't sound like migraines clinically. No neck or back pain presently. Several other conditions ruled out with neg MRI. Will advise Neuro f/u if the tingling persists.    Derwood Kaplan, MD 08/18/14 1610  Derwood Kaplan, MD 08/18/14 [id number]

## 2014-08-18 NOTE — ED Notes (Signed)
Patient transported to MRI with [place]

## 2014-08-18 NOTE — Consult Note (Signed)
Neurology Consultation Reason for Consult: Left-sided numbness Referring Physician: Rhunette Croft, A  CC: Left-sided numbness  History is obtained from: Patient  HPI: Christine Chaney is a 39 y.o. female recently diagnosed with hypertension who was in her normal state of health until 9 PM. At that time, she noticed some tingling in the left side of her face which gradually over the next few minutes spread down to involve arm and finally her leg to some degree as well. Sometime after that, she started noticing the headache on the left side of her head. She denies photophobia or previous migraines.  She denies any visual symptoms, weakness, difficulty walking. She drove herself to the emergency room  LKW: 9 PM tpa given?: no, mild symptoms    ROS: A 14 point ROS was performed and is negative except as noted in the HPI.   Past Medical History  Diagnosis Date  . Hypertension      PMH: No hx migraine  Social History:  reports that she has never smoked. She has never used smokeless tobacco. She reports that she does not drink alcohol or use illicit drugs.   Exam: Current vital signs: BP 142/97 mmHg  Pulse 84  Resp 14  SpO2 98% Vital signs in last 24 hours: Pulse Rate:  [84-86] 84 (08/15 0130) Resp:  [14-16] 14 (08/15 0130) BP: (142-146)/(92-99) 142/97 mmHg (08/15 0130) SpO2:  [98 %-99 %] 98 % (08/15 0130)   Physical Exam  Constitutional: Appears well-developed and well-nourished.  Psych: Affect appropriate to situation Eyes: No scleral injection HENT: No OP obstrucion Head: Normocephalic.  Cardiovascular: Normal rate and regular rhythm.  Respiratory: Effort normal and breath sounds normal to anterior ascultation GI: Soft.  No distension. There is no tenderness.  Skin: WDI  Neuro: Mental Status: Patient is awake, alert, oriented to person, place, month, year, and situation. Patient is able to give a clear and coherent history. No signs of aphasia or neglect Cranial  Nerves: II: Visual Fields are full. Pupils are equal, round, and reactive to light.   III,IV, VI: EOMI without ptosis or diploplia.  V: Facial sensation is decreased on the left VII: Facial movement is symmetric.  VIII: hearing is intact to voice X: Uvula elevates symmetrically XI: Shoulder shrug is symmetric. XII: tongue is midline without atrophy or fasciculations.  Motor: Tone is normal. Bulk is normal. 5/5 strength was present in all four extremities.  Sensory: Sensation is decreased on the left.  Deep Tendon Reflexes: 2+ and symmetric in the biceps and patellae.  Cerebellar: FNF and HKS are intact bilaterally         I have reviewed labs in epic and the results pertinent to this consultation are: cmp - unremarkable.   I have reviewed the images obtained: CT head-unremarkable  Impression: 39 year old female with spreading paresthesia involving her left face, arm, leg or the course of 10 minutes in addition to unilateral headache. Possibilities include complicated migraine vs. Thalamic infarct. Given the mild nature of her symptoms I would not recommend IV TPA even if a stroke. If an MRI is negative, I would favor treating this as complicated migraine.  Recommendations: 1. MRI brain, full stroke workup if positive, treat as complicated migraine if negative.   Ritta Slot, MD [facility] [phone]  If 7pm- 7am, please page neurology on call as listed in AMION.

## 2014-08-18 NOTE — ED Notes (Signed)
Pt arrived via POV to nurse first with c/o L sided facial and L arm numbness and tingling.  LKW 2100.  No neuro deficits noted on triage exam.  Code Stroke activated and pt taken straight to bridge for Dr. Rhunette Croft to assess airway.  Hand-off given to Huntley Dec, Charity fundraiser.

## 2014-08-18 NOTE — ED Notes (Signed)
Paged out Code Stroke @ [phone]

## 2016-02-07 IMAGING — CT CT HEAD W/O CM
1 series · 15 of 30 positions shown, 19 images · non-contrast
Comparison: CT of the paranasal sinuses performed 01/09/2004

CLINICAL DATA: Code stroke. Acute onset of left arm numbness and
left facial numbness. Initial encounter.

EXAM:
CT HEAD WITHOUT CONTRAST
TECHNIQUE: Contiguous axial images were obtained from the base of the skull
through the vertex without intravenous contrast.

[Series 2: head 5.0 h30s · axial · 0.46mm/px · z∈[+1477,+1617]mm · 15 of 32 slices shown, 19 images]
[im 2/32  brain]
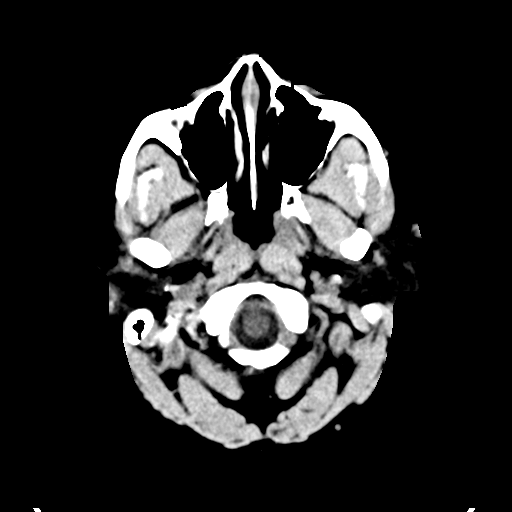
[im 2/32  bone]
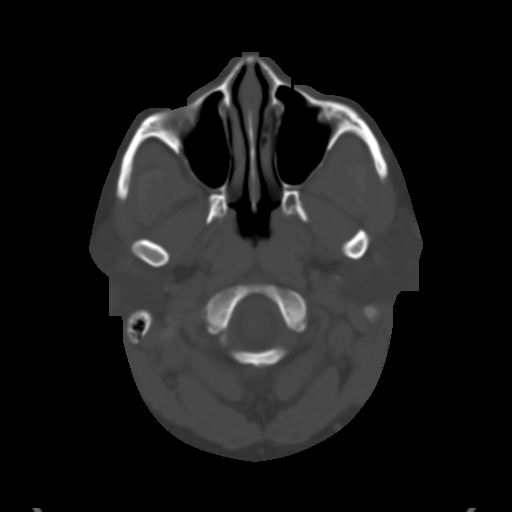
[im 4/32  brain]
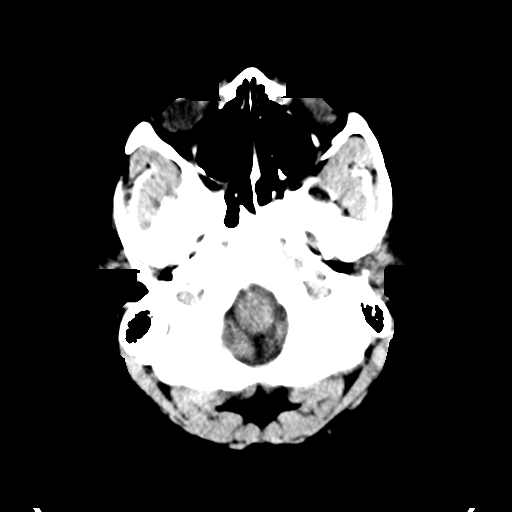
[im 6/32  brain]
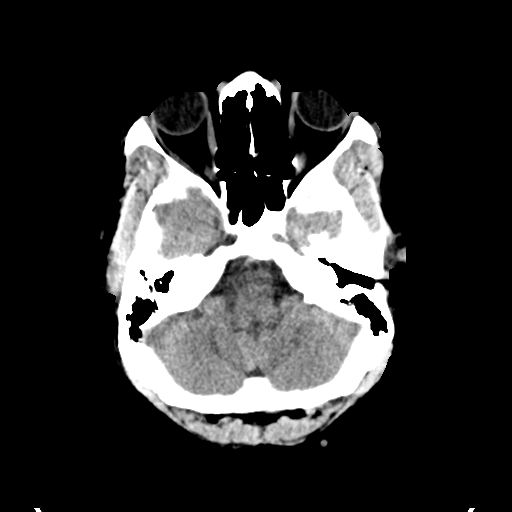
[im 8/32  brain]
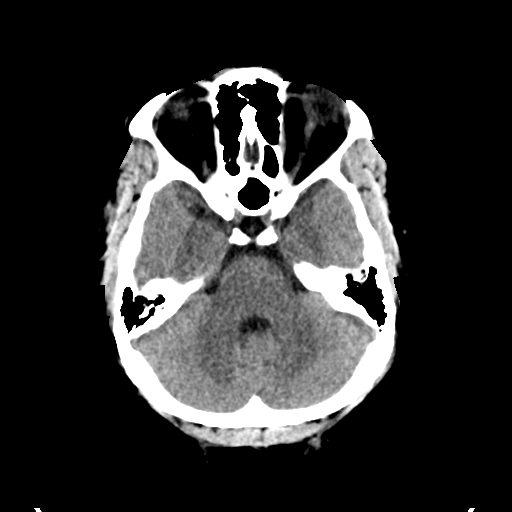
[im 10/32  brain]
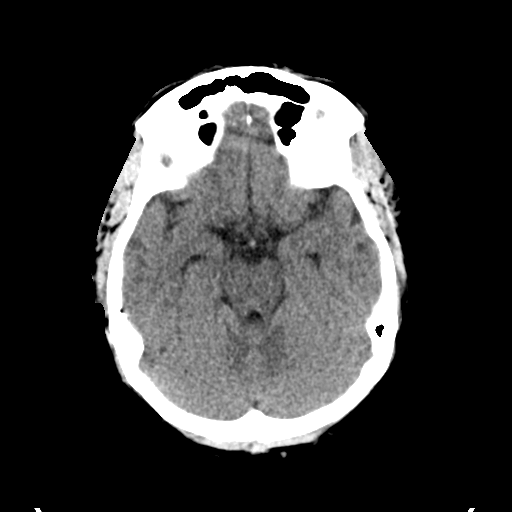
[im 10/32  bone]
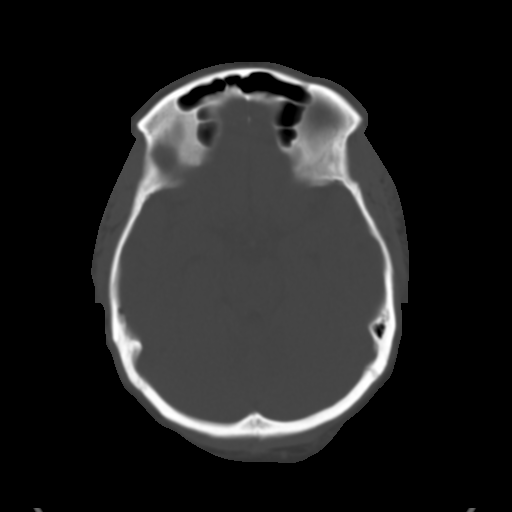
[im 12/32  brain]
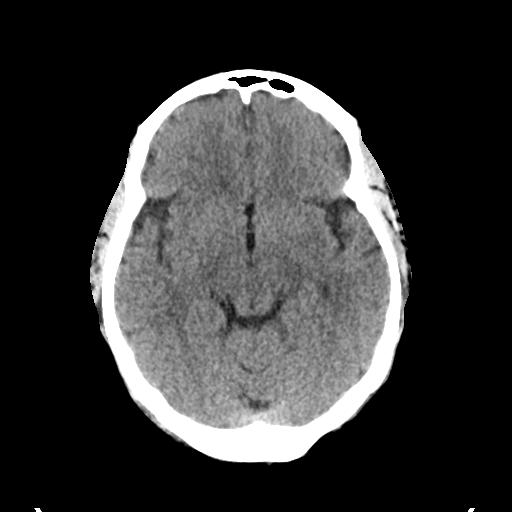
[im 14/32  brain]
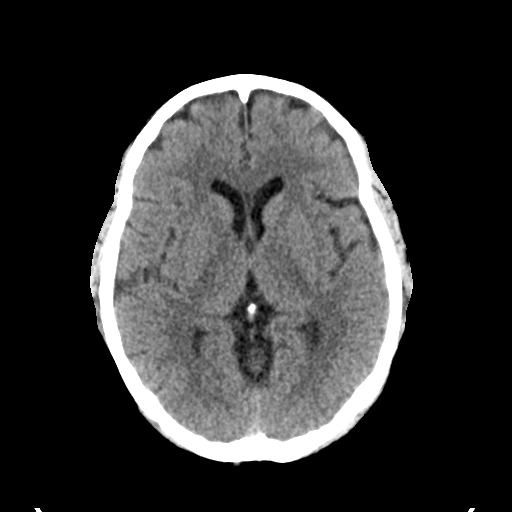
[im 17/32  brain]
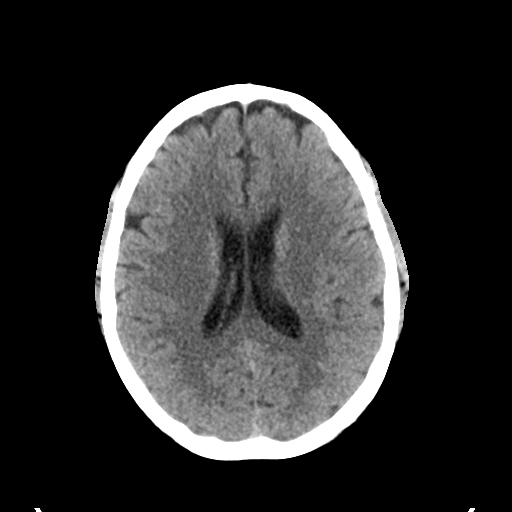
[im 18/32  brain]
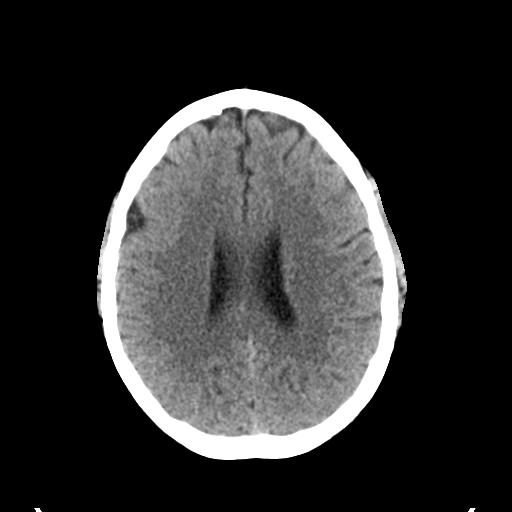
[im 18/32  bone]
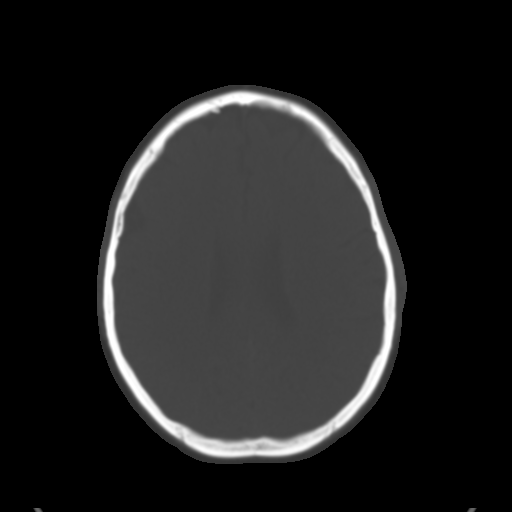
[im 20/32  brain]
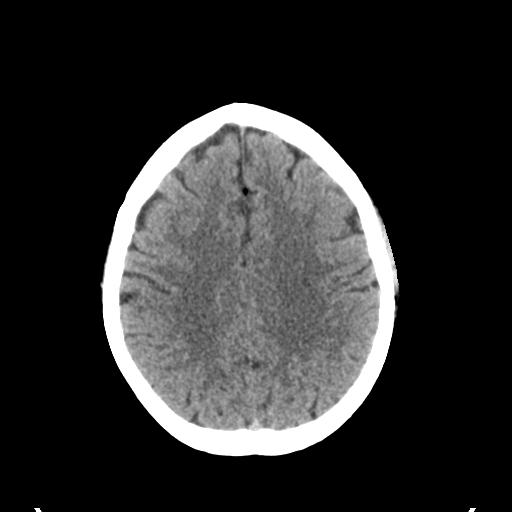
[im 22/32  brain]
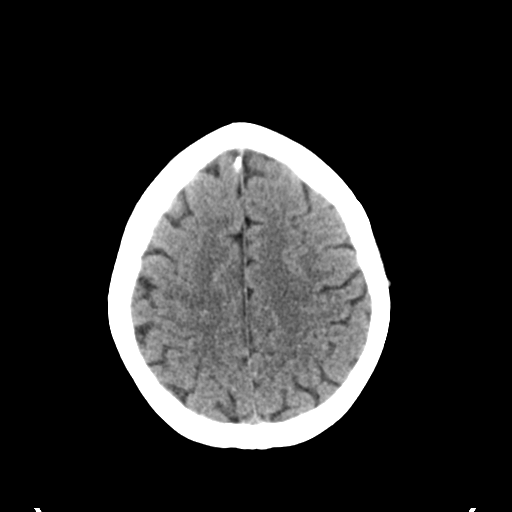
[im 24/32  brain]
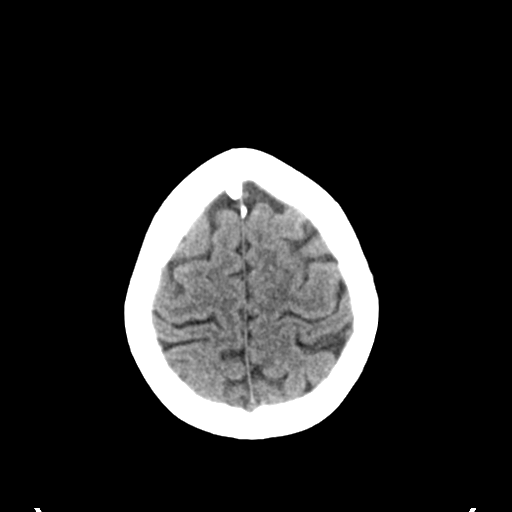
[im 26/32  brain]
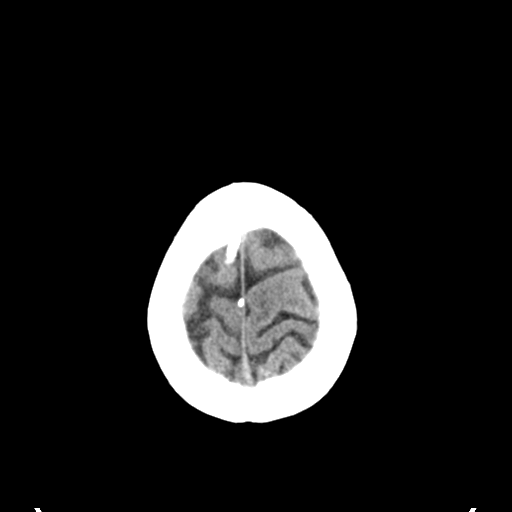
[im 26/32  bone]
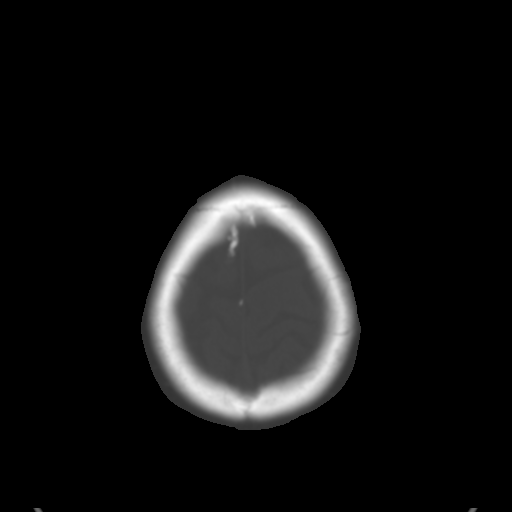
[im 28/32  brain]
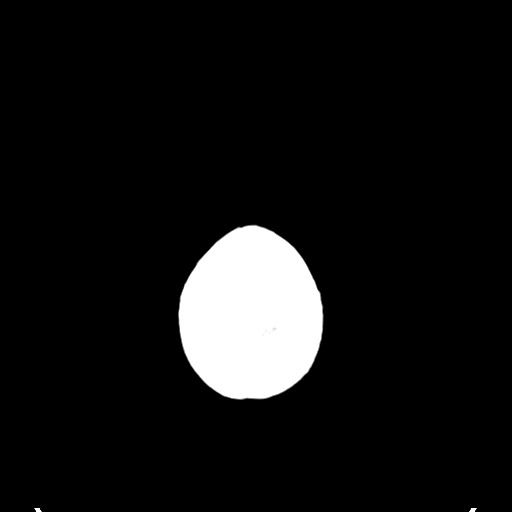
[im 30/32  brain]
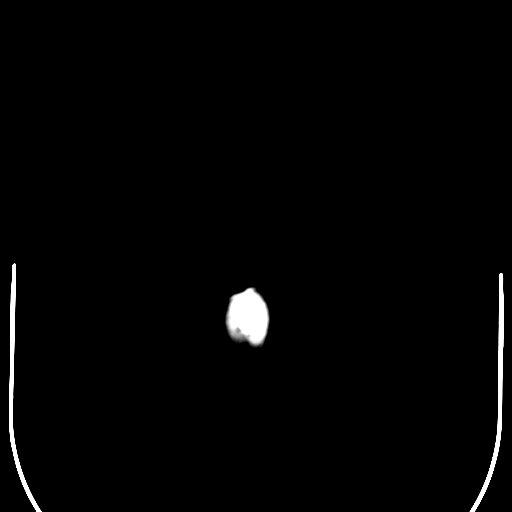

[15 of 30 positions shown; findings below may reference images not displayed]

FINDINGS: There is no evidence of acute infarction, mass lesion, or intra- or
extra-axial hemorrhage on CT.

The posterior fossa, including the cerebellum, brainstem and fourth
ventricle, is within normal limits. The third and lateral
ventricles, and basal ganglia are unremarkable in appearance. The
cerebral hemispheres are symmetric in appearance, with normal
gray-white differentiation. No mass effect or midline shift is seen.

There is no evidence of fracture; visualized osseous structures are
unremarkable in appearance. The orbits are within normal limits. The
paranasal sinuses and mastoid air cells are well-aerated. No
significant soft tissue abnormalities are seen.
IMPRESSION: Unremarkable noncontrast CT of the head.

These results were called by telephone at the time of interpretation
on 08/18/2014 at [DATE] to Dr. Tiger, who verbally
acknowledged these results.
